# Patient Record
Sex: Male | Born: 1995 | Race: White | Hispanic: No | Marital: Married | Smoking: Former smoker
Health system: Southern US, Community
[De-identification: ages and names within clinical notes are randomized; demographics above are authoritative.]

---
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
## 2014-08-01 ENCOUNTER — Emergency Department
Admit: 2014-08-01 | Disposition: A | Discharge status: Home / Self Care | Payer: Self-pay | Admitting: Emergency Medicine

## 2014-08-01 LAB — CBC
HCT: 44.3 % (ref 40.0–52.0)
HGB: 14.7 g/dL (ref 13.0–18.0)
MCH: 28.4 pg (ref 26.0–34.0)
MCHC: 33.2 g/dL (ref 32.0–36.0)
MCV: 86 fL (ref 80–100)
Platelet: 258 10*3/uL (ref 150–440)
RBC: 5.17 10*6/uL (ref 4.40–5.90)
RDW: 13.3 % (ref 11.5–14.5)
WBC: 16.2 10*3/uL — ABNORMAL HIGH (ref 3.8–10.6)

## 2014-08-01 LAB — URINALYSIS, COMPLETE
BACTERIA: NONE SEEN
Bilirubin,UR: NEGATIVE
GLUCOSE, UR: NEGATIVE mg/dL (ref 0–75)
LEUKOCYTE ESTERASE: NEGATIVE
Nitrite: NEGATIVE
Ph: 5 (ref 4.5–8.0)
SPECIFIC GRAVITY: 1.033 (ref 1.003–1.030)

## 2014-08-01 LAB — COMPREHENSIVE METABOLIC PANEL
ALK PHOS: 71 U/L
ALT: 20 U/L
AST: 26 U/L
Albumin: 5.1 g/dL — ABNORMAL HIGH
Anion Gap: 8 (ref 7–16)
BUN: 11 mg/dL
Bilirubin,Total: 0.6 mg/dL
CHLORIDE: 102 mmol/L
Calcium, Total: 9 mg/dL
Co2: 22 mmol/L
Creatinine: 1.19 mg/dL
EGFR (African American): >60
Glucose: 129 mg/dL — ABNORMAL HIGH
Potassium: 3.1 mmol/L — ABNORMAL LOW
Sodium: 132 mmol/L — ABNORMAL LOW
Total Protein: 7.6 g/dL

## 2014-08-06 NOTE — Op Note (Signed)
PATIENT NAME:  Cameron Martin, Cameron Martin MR#:  [id number] DATE OF BIRTH:  [birth year]  DATE OF PROCEDURE:  06/03/2011  PREOPERATIVE DIAGNOSES:  1. Right acute otitis media.  2. Right facial nerve paralysis.   POSTOPERATIVE DIAGNOSES: 1. Right acute otitis media.  2. Right facial nerve paralysis.   PROCEDURE PERFORMED: Right myringotomy, modifier 22.   SURGEON: Kyung Rudd, MD    ANESTHESIA: General mask anesthesia.   ESTIMATED BLOOD LOSS: 0 mL.   IV FLUIDS: Please see anesthesia record.   COMPLICATIONS: None.   DRAINS/STENT PLACEMENTS: None.   SPECIMENS: Right middle ear fluid.   INDICATIONS FOR PROCEDURE: The patient is a 19 year old male with a one day history of a dense right facial nerve paralysis found on MRI and CT scan to have right middle ear effusion and opacification of mastoid cells. There is no evidence of coalescence of mastoid air cells. The patient presents for right myringotomy and possible tympanostomy tube placement.   OPERATIVE FINDINGS: Severely thickened right tympanic membrane. Myringotomy was initially placed in the posterior/inferior aspect of the tympanic membrane. Multiple vesicles and severe erythema and edema of the drum were encountered. Serous effusion was noted in the middle ear space. This was suctioned with a combination size 5 and size 24 otologic suctions. Severe thickening of the drum prevented placement of the tube despite multiple attempts and multiple myringotomies in the posterior/inferior and anterior/inferior aspect. While fluid was able to be expressed from the middle ear space, the tube was unable to be placed through the tympanic membrane secondary to severe thickening of the drum.   DESCRIPTION OF PROCEDURE: The patient was identified in holding, the benefits and risks of the procedure were discussed. Consent was reviewed. The patient was taken to the operating room and placed in the supine position. General mask anesthesia was induced. An  operating microscope was brought onto the field. An appropriate size speculum was placed in the patient's right external auditory canal and visualization was made of the canal. This demonstrated a severe erythematous drum with multiple vesicles on top of the drum. A myringotomy blade was used to lyse some of these vesicles for further evaluation of the drum. This revealed a dull and erythematous drum. In the posterior/inferior aspect of the tympanic membrane a myringotomy was placed and a significant amount of yellowish serous effusion was expressed from the middle ear space. This was removed with size 5 and size 24 otologic suction. At this time an attempt was made for placement of a myringotomy tube through myringotomy site. This was not possible secondary to severe thickening of the drum and continued failure of the PE tube to stay within the myringotomy site. Multiple attempts were made at slightly enlarging the myringotomy in this area and multiple repeated attempts were made with failure for keeping the tube through the myringotomy site. At this time continued serous effusion was coming from the middle ear space and decision was made to proceed to the anterior/inferior aspect of the tympanic membrane. Multiple vessels were lysed in this area again allowing for visualization of the of the normal anterior drum. A myringotomy was placed in a radial fashion and again serous fluid was expressed from the middle ear space through the myringotomy site and multiple attempts were made for placement of the PE tube through the anterior/inferior myringotomy site. Again, this was not successful due to the severe thickening of the drum and significant erythema surrounding and obscuring normal landmarks. At this time due to multiple myringotomies with failure  of placement of a tube for greater than 25 minutes, decision was made to abort the tympanostomy tube placement especially given the significant amount of fluid that had  been expressed from the anterior/inferior and posterior/inferior myringotomies. Ciprodex drops were instilled in the ear and care of the patient was transferred to anesthesia. The patient was in PAC-U in good condition.  ____________________________ Kyung Ruddreighton C. Laurance Heide, MD ccv:drc D:        06/03/2011 18:26:50 ET         T:         06/04/2011 07:41:06 ET         JOB#:  [id number]  cc:        Kyung Ruddreighton C. Sabas Frett, MD, <Dictator> Kyung RuddREIGHTON C Alanys Godino MD ELECTRONICALLY SIGNED 06/09/2011 9:59

## 2018-09-07 ENCOUNTER — Telehealth: Payer: Self-pay | Admitting: Nurse Practitioner

## 2018-09-07 DIAGNOSIS — J0100 Acute maxillary sinusitis, unspecified: Secondary | ICD-10-CM

## 2018-09-07 NOTE — Progress Notes (Signed)
We are sorry that you are not feeling well.  Here is how we plan to help!  This could very well b just a sinus infection, but you do have a lot of symptoms similar to corona virus. So if you are not getting better in the next few days then you may need to be tested.  Based on what you have shared with me it looks like you have sinusitis.  Sinusitis is inflammation and infection in the sinus cavities of the head.  Based on your presentation I believe you most likely have Acute Bacterial Sinusitis.  This is an infection caused by bacteria and is treated with antibiotics. I have prescribed Augmentin 875mg /125mg  one tablet twice daily with food, for 7 days. You may use an oral decongestant such as Mucinex D or if you have glaucoma or high blood pressure use plain Mucinex. Saline nasal spray help and can safely be used as often as needed for congestion.  If you develop worsening sinus pain, fever or notice severe headache and vision changes, or if symptoms are not better after completion of antibiotic, please schedule an appointment with a health care provider.    Sinus infections are not as easily transmitted as other respiratory infection, however we still recommend that you avoid close contact with loved ones, especially the very young and elderly.  Remember to wash your hands thoroughly throughout the day as this is the number one way to prevent the spread of infection!  Home Care:  Only take medications as instructed by your medical team.  Complete the entire course of an antibiotic.  Do not take these medications with alcohol.  A steam or ultrasonic humidifier can help congestion.  You can place a towel over your head and breathe in the steam from hot water coming from a faucet.  Avoid close contacts especially the very young and the elderly.  Cover your mouth when you cough or sneeze.  Always remember to wash your hands.  Get Help Right Away If:  You develop worsening fever or sinus  pain.  You develop a severe head ache or visual changes.  Your symptoms persist after you have completed your treatment plan.  Make sure you  Understand these instructions.  Will watch your condition.  Will get help right away if you are not doing well or get worse.  Your e-visit answers were reviewed by a board certified advanced clinical practitioner to complete your personal care plan.  Depending on the condition, your plan could have included both over the counter or prescription medications.  If there is a problem please reply  once you have received a response from your provider.  Your safety is important to [place].  If you have drug allergies check your prescription carefully.    You can use MyChart to ask questions about today's visit, request a non-urgent call back, or ask for a work or school excuse for 24 hours related to this e-Visit. If it has been greater than 24 hours you will need to follow up with your provider, or enter a new e-Visit to address those concerns.  You will get an e-mail in the next two days asking about your experience.  I hope that your e-visit has been valuable and will speed your recovery. Thank you for using e-visits.  5-10 minutes spent reviewing and documenting in chart.

## 2018-09-11 MED ORDER — AMOXICILLIN-POT CLAVULANATE 875-125 MG PO TABS: 1.0000 | ORAL_TABLET | Freq: Two times a day (BID) | ORAL | 0 refills | Status: DC

## 2018-09-11 NOTE — Addendum Note (Signed)
Addended by: Bennie Pierini on: 09/11/2018 09:59 AM   Modules accepted: Orders

## 2018-12-14 ENCOUNTER — Telehealth: Payer: Self-pay | Admitting: Nurse Practitioner

## 2018-12-14 DIAGNOSIS — R112 Nausea with vomiting, unspecified: Secondary | ICD-10-CM

## 2018-12-14 MED ORDER — ONDANSETRON HCL 4 MG PO TABS: 4.0000 mg | ORAL_TABLET | Freq: Three times a day (TID) | ORAL | 0 refills | Status: DC | PRN

## 2018-12-14 NOTE — Progress Notes (Signed)

## 2019-02-18 ENCOUNTER — Other Ambulatory Visit: Payer: Self-pay | Admitting: *Deleted

## 2019-02-18 ENCOUNTER — Telehealth: Payer: Self-pay | Admitting: Nurse Practitioner

## 2019-02-18 DIAGNOSIS — Z20822 Contact with and (suspected) exposure to covid-19: Secondary | ICD-10-CM

## 2019-02-18 DIAGNOSIS — R197 Diarrhea, unspecified: Secondary | ICD-10-CM

## 2019-02-18 MED ORDER — AZITHROMYCIN 500 MG PO TABS: 500.0000 mg | ORAL_TABLET | Freq: Every day | ORAL | 0 refills | Status: AC

## 2019-02-18 MED ORDER — ONDANSETRON HCL 4 MG PO TABS: 4.0000 mg | ORAL_TABLET | Freq: Three times a day (TID) | ORAL | 0 refills | Status: AC | PRN

## 2019-02-18 NOTE — Progress Notes (Signed)
We are sorry that you are not feeling well.  Here is how we plan to help!  Based on what you have shared with me it looks like you have Acute Infectious Diarrhea.  Most cases of acute diarrhea are due to infections with virus and bacteria and are self-limited conditions lasting less than 14 days.  For your symptoms you may take Imodium 2 mg tablets that are over the counter at your local pharmacy. Take two tablet now and then one after each loose stool up to 6 a day.  Antibiotics are not needed for most people with diarrhea.  Optional: Zofran 4 mg 1 tablet every 8 hours as needed for nausea and vomiting  Optional: I have prescribed azithromycin 500 mg daily for 3 days  HOME CARE We recommend changing your diet to help with your symptoms for the next few days. Drink plenty of fluids that contain water salt and sugar. Sports drinks such as Gatorade may help.  You may try broths, soups, bananas, applesauce, soft breads, mashed potatoes or crackers.  You are considered infectious for as long as the diarrhea continues. Hand washing or use of alcohol based hand sanitizers is recommend. It is best to stay out of work or school until your symptoms stop.   GET HELP RIGHT AWAY If you have dark yellow colored urine or do not pass urine frequently you should drink more fluids.   If your symptoms worsen  If you feel like you are going to pass out (faint) You have a new problem  MAKE SURE YOU  Understand these instructions. Will watch your condition. Will get help right away if you are not doing well or get worse.  Your e-visit answers were reviewed by a board certified advanced clinical practitioner to complete your personal care plan.  Depending on the condition, your plan could have included both over the counter or prescription medications.  If there is a problem please reply  once you have received a response from your provider.  Your safety is important to us.  If you have drug allergies  check your prescription carefully.    You can use MyChart to ask questions about today's visit, request a non-urgent call back, or ask for a work or school excuse for 24 hours related to this e-Visit. If it has been greater than 24 hours you will need to follow up with your provider, or enter a new e-Visit to address those concerns.   You will get an e-mail in the next two days asking about your experience.  I hope that your e-visit has been valuable and will speed your recovery. Thank you for using e-visits.   I have spent at least 5 minutes reviewing and documenting in the patient's chart.  

## 2019-02-19 LAB — NOVEL CORONAVIRUS, NAA: SARS-CoV-2, NAA: NOT DETECTED

## 2021-03-02 ENCOUNTER — Emergency Department
Admission: EM | Admit: 2021-03-02 | Discharge: 2021-03-02 | Disposition: A | Discharge status: Home / Self Care | Payer: Medicaid Other | Attending: Emergency Medicine | Admitting: Emergency Medicine

## 2021-03-02 ENCOUNTER — Other Ambulatory Visit: Payer: Self-pay

## 2021-03-02 ENCOUNTER — Emergency Department: Payer: Medicaid Other

## 2021-03-02 DIAGNOSIS — Y9301 Activity, walking, marching and hiking: Secondary | ICD-10-CM | POA: Insufficient documentation

## 2021-03-02 DIAGNOSIS — S92155A Nondisplaced avulsion fracture (chip fracture) of left talus, initial encounter for closed fracture: Secondary | ICD-10-CM | POA: Insufficient documentation

## 2021-03-02 DIAGNOSIS — M25572 Pain in left ankle and joints of left foot: Secondary | ICD-10-CM | POA: Insufficient documentation

## 2021-03-02 DIAGNOSIS — X501XXA Overexertion from prolonged static or awkward postures, initial encounter: Secondary | ICD-10-CM | POA: Insufficient documentation

## 2021-03-02 DIAGNOSIS — S93402A Sprain of unspecified ligament of left ankle, initial encounter: Secondary | ICD-10-CM

## 2021-03-02 MED ORDER — OXYCODONE-ACETAMINOPHEN 5-325 MG PO TABS: 1.0000 | ORAL_TABLET | Freq: Four times a day (QID) | ORAL | 0 refills | Status: DC | PRN

## 2021-03-02 MED ORDER — OXYCODONE-ACETAMINOPHEN 5-325 MG PO TABS: 2.0000 | ORAL_TABLET | ORAL | Status: DC

## 2021-03-02 MED ORDER — IBUPROFEN 600 MG PO TABS: 600.0000 mg | ORAL_TABLET | ORAL | Status: DC

## 2021-03-02 NOTE — Discharge Instructions (Addendum)
No driving or operating heavy equipment while taking oxycodone or while in ankle brace.

## 2021-03-02 NOTE — ED Notes (Signed)
Pt endorsing that they rolled their ankle last night on an unever surface and now they cannot bear weight on their L ankle. Pt L lateral ankle appears swollen and bruised,

## 2021-03-02 NOTE — ED Triage Notes (Signed)
Pt c/o rolling left ankle yesterday.

## 2021-03-02 NOTE — ED Provider Notes (Signed)
[facility] Emergency Department Provider Note   ____________________________________________   Event Date/Time   First MD Initiated Contact with Patient 03/02/21 1333     (approximate)  I have reviewed the triage vital signs and the nursing notes.   HISTORY  Chief Complaint Ankle Pain    HPI Cameron Martin is a 25 y.o. male who yesterday was walking and stepped off a curb but sort of Mr. Misjudged it.  His left ankle rolled underneath him.  He had pain and swelling throughout the evening.  A slight sense of off-and-on tingling in his toes of his left foot.  Not currently tingly or numb.  No loss of mobility other than is painful to move his ankle, and he is swollen behind the ankle and a little bit over the left side of the ankle  No other injuries no head strike.  No recent illnesses no cough cold or other symptoms.  Denies any injury elsewhere no knee or hip pain.  No injury arms or legs  Use Tylenol at home last night  History reviewed. No pertinent past medical history.  There are no problems to display for this patient.   History reviewed. No pertinent surgical history.  Medications, none except for as needed Tylenol last night   Allergies Patient has no allergy information on record.  No family history on file.  Social History    Review of Systems Constitutional: No fever/chills Eyes: No visual changes. ENT: No sore throat. Cardiovascular: Denies chest pain. Respiratory: Denies shortness of breath. Musculoskeletal: See HPI, all isolated left ankle Skin: Negative for rash. Neurological: The HPI.  No weakness in the leg    ____________________________________________   PHYSICAL EXAM:  VITAL SIGNS: ED Triage Vitals  Enc Vitals Group     BP 03/02/21 1130 (!) 133/92     Pulse Rate 03/02/21 1130 85     Resp 03/02/21 1129 20     Temp 03/02/21 1129 97.9 F (36.6 C)     Temp Source 03/02/21 1129 Oral     SpO2 03/02/21  1130 97 %     Weight 03/02/21 1129 285 lb (129.3 kg)     Height 03/02/21 1129 5\' 11"  (1.803 m)     Head Circumference --      Peak Flow --      Pain Score 03/02/21 1129 8     Pain Loc --      Pain Edu? --      Excl. in GC? --     Constitutional: Alert and oriented. Well appearing and in no acute distress.  Very pleasant seated upright watching television. Eyes: Conjunctivae are normal. Head: Atraumatic. Nose: No congestion/rhinnorhea. Mouth/Throat: Mucous membranes are moist. Neck: No stridor.  Cardiovascular: Strong peripheral pulses, normal dorsalis pedis and posterior tibial pulse left foot normal capillary refill all toes left foot with normal rate Respiratory: Normal respiratory effort.  No retractions. Gastrointestinal: Soft and nontender. No distention. Musculoskeletal:   Right lower extremity appears uninjured grossly normal use and range of motion.  Lower Extremities    Normal neuro-motor function lower extremities bilateral.  LEFT Left lower extremity demonstrates normal strength, good use of all muscles. No edema bruising or contusions of the hip,  ankle. Full range of motion of the left lower extremity without pain except some pain through range of motion particular on the left lateral ankle region.  Has moderate edema and some bruising over the lateral and posterior left ankle more so over the  foot and the ankle joint.  No noted joint effusion.  No pain across the foot except along the lateral tarsal region.  Full range of motion of the toes.   Neurologic:  Normal speech and language. No gross focal neurologic deficits are appreciated.  Skin:  Skin is warm, dry and intact. No rash noted. Psychiatric: Mood and affect are normal. Speech and behavior are normal.  ____________________________________________   LABS (all labs ordered are listed, but only abnormal results are displayed)  Labs Reviewed - No data to  display ____________________________________________  EKG   ____________________________________________  RADIOLOGY  DG Ankle Complete Left  Result Date: 03/02/2021 CLINICAL DATA:  Patient rolled left ankle yesterday now difficulty with weight-bearing. EXAM: LEFT ANKLE COMPLETE - 3+ VIEW COMPARISON:  None. FINDINGS: There is a tiny fragment along the lateral talus best seen on oblique view which could represent a tiny fracture. No additional acute fracture. No evidence of dislocation. There is lateral soft tissue swelling. IMPRESSION: Tiny fragment along the lateral talus could represent a fracture. There is associated lateral soft tissue swelling. Electronically Signed   By: Emmaline Kluver M.D.   On: 03/02/2021 12:27     X-rays reviewed tiny fragment lateral talus may represent small fracture. ____________________________________________   PROCEDURES  Procedure(s) performed: None  Procedures  Critical Care performed: No  ____________________________________________   INITIAL IMPRESSION / ASSESSMENT AND PLAN / ED COURSE  Pertinent labs & imaging results that were available during my care of the patient were reviewed by me and considered in my medical decision making (see chart for details).   Patient presents after rolling over his left ankle.  Appears to be isolated injury without evidence of acute neurologic vascular compromise.  No noted bony fracture except for possibly a very small avulsion type fragment, I suspect he may have suffered a notable lateral ankle sprain possibly associate with small chip fracture.  Patient placed in Aircast, crutches, pain control  I will prescribe the patient a narcotic pain medicine due to their condition which I anticipate will cause at least moderate pain short term. I discussed with the patient safe use of narcotic pain medicines, and that they are not to drive, work in dangerous areas, or ever take more than prescribed (no more than 1  pill every 6 hours). We discussed that this is the type of medication that can be  overdosed on and the risks of this type of medicine. Patient is very agreeable to only use as prescribed and to never use more than prescribed.  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  Patient understanding, agreeable with this and will discuss with them for possible work modification which I did recommend to him light duty.  We will be following up with orthopedics        ____________________________________________   FINAL CLINICAL IMPRESSION(S) / ED DIAGNOSES  Final diagnoses:  Sprain of left ankle, unspecified ligament, initial encounter  Nondisp avuls fracture (chip fracture) of left talus, init        Note:  This document was prepared using Dragon voice recognition software and may include unintentional dictation errors       Sharyn Creamer, MD 03/02/21 1441

## 2021-04-18 ENCOUNTER — Telehealth: Payer: Medicaid Other | Admitting: Family Medicine

## 2021-04-18 DIAGNOSIS — B9689 Other specified bacterial agents as the cause of diseases classified elsewhere: Secondary | ICD-10-CM

## 2021-04-18 DIAGNOSIS — J019 Acute sinusitis, unspecified: Secondary | ICD-10-CM

## 2021-04-18 MED ORDER — DOXYCYCLINE HYCLATE 100 MG PO TABS: 100.0000 mg | ORAL_TABLET | Freq: Two times a day (BID) | ORAL | 0 refills | Status: AC

## 2021-04-18 NOTE — Progress Notes (Signed)

## 2021-06-26 ENCOUNTER — Telehealth: Payer: 59 | Admitting: Physician Assistant

## 2021-06-26 DIAGNOSIS — R112 Nausea with vomiting, unspecified: Secondary | ICD-10-CM | POA: Diagnosis not present

## 2021-06-26 DIAGNOSIS — R197 Diarrhea, unspecified: Secondary | ICD-10-CM | POA: Diagnosis not present

## 2021-06-26 DIAGNOSIS — J069 Acute upper respiratory infection, unspecified: Secondary | ICD-10-CM

## 2021-06-26 MED ORDER — ONDANSETRON HCL 4 MG PO TABS: 4.0000 mg | ORAL_TABLET | Freq: Three times a day (TID) | ORAL | 0 refills | Status: AC | PRN

## 2021-06-26 MED ORDER — FLUTICASONE PROPIONATE 50 MCG/ACT NA SUSP: 2.0000 | Freq: Every day | NASAL | 0 refills | Status: DC

## 2021-06-26 MED ORDER — BENZONATATE 100 MG PO CAPS: 100.0000 mg | ORAL_CAPSULE | Freq: Three times a day (TID) | ORAL | 0 refills | Status: AC

## 2021-06-26 NOTE — Progress Notes (Signed)
E-Visit for Upper Respiratory Infection  ? ?We are sorry you are not feeling well.  Here is how we plan to help! ? ?Based on what you have shared with me, it looks like you may have a viral upper respiratory infection.  Upper respiratory infections are caused by a large number of viruses; however, rhinovirus is the most common cause.  ? ?Symptoms vary from person to person, with common symptoms including sore throat, cough, fatigue or lack of energy and feeling of general discomfort.  A low-grade fever of up to 100.4 may present, but is often uncommon.  Symptoms vary however, and are closely related to a person's age or underlying illnesses.  The most common symptoms associated with an upper respiratory infection are nasal discharge or congestion, cough, sneezing, headache and pressure in the ears and face.  These symptoms usually persist for about 3 to 10 days, but can last up to 2 weeks.  It is important to know that upper respiratory infections do not cause serious illness or complications in most cases.   ? ?Upper respiratory infections can be transmitted from person to person, with the most common method of transmission being a person's hands.  The virus is able to live on the skin and can infect other persons for up to 2 hours after direct contact.  Also, these can be transmitted when someone coughs or sneezes; thus, it is important to cover the mouth to reduce this risk.  To keep the spread of the illness at bay, good hand hygiene is very important. ? ?This is an infection that is most likely caused by a virus. There are no specific treatments other than to help you with the symptoms until the infection runs its course.  We are sorry you are not feeling well.  Here is how we plan to help! ? ? ?For nasal congestion, you may use an oral decongestants such as Mucinex D or if you have glaucoma or high blood pressure use plain Mucinex.  Saline nasal spray or nasal drops can help and can safely be used as often as  needed for congestion.  For your congestion, I have prescribed Fluticasone nasal spray one spray in each nostril twice a day ? ?If you do not have a history of heart disease, hypertension, diabetes or thyroid disease, prostate/bladder issues or glaucoma, you may also use Sudafed to treat nasal congestion.  It is highly recommended that you consult with a pharmacist or your primary care physician to ensure this medication is safe for you to take.    ? ?If you have a cough, you may use cough suppressants such as Delsym and Robitussin.  If you have glaucoma or high blood pressure, you can also use Coricidin HBP.   ?For cough I have prescribed for you A prescription cough medication called Tessalon Perles 100 mg. You may take 1-2 capsules every 8 hours as needed for cough ? ?For vomiting I have prescribed zofran 4 mg to take every 8 hours. ? ?If you have a sore or scratchy throat, use a saltwater gargle- ? to ? teaspoon of salt dissolved in a 4-ounce to 8-ounce glass of warm water.  Gargle the solution for approximately 15-30 seconds and then spit.  It is important not to swallow the solution.  You can also use throat lozenges/cough drops and Chloraseptic spray to help with throat pain or discomfort.  Warm or cold liquids can also be helpful in relieving throat pain. ? ?For headache, pain or general  discomfort, you can use Ibuprofen or Tylenol as directed.   ?Some authorities believe that zinc sprays or the use of Echinacea may shorten the course of your symptoms. ? ? ?HOME CARE ?Only take medications as instructed by your medical team. ?Be sure to drink plenty of fluids. Water is fine as well as fruit juices, sodas and electrolyte beverages. You may want to stay away from caffeine or alcohol. If you are nauseated, try taking small sips of liquids. How do you know if you are getting enough fluid? Your urine should be a pale yellow or almost colorless. ?Get rest. ?Taking a steamy shower or using a humidifier may help  nasal congestion and ease sore throat pain. You can place a towel over your head and breathe in the steam from hot water coming from a faucet. ?Using a saline nasal spray works much the same way. ?Cough drops, hard candies and sore throat lozenges may ease your cough. ?Avoid close contacts especially the very young and the elderly ?Cover your mouth if you cough or sneeze ?Always remember to wash your hands.  ? ?GET HELP RIGHT AWAY IF: ?You develop worsening fever. ?If your symptoms do not improve within 10 days ?You develop yellow or green discharge from your nose over 3 days. ?You have coughing fits ?You develop a severe head ache or visual changes. ?You develop shortness of breath, difficulty breathing or start having chest pain ?Your symptoms persist after you have completed your treatment plan ? ?MAKE SURE YOU  ?Understand these instructions. ?Will watch your condition. ?Will get help right away if you are not doing well or get worse. ? ?Thank you for choosing an e-visit. ? ?Your e-visit answers were reviewed by a board certified advanced clinical practitioner to complete your personal care plan. Depending upon the condition, your plan could have included both over the counter or prescription medications. ? ?Please review your pharmacy choice. Make sure the pharmacy is open so you can pick up prescription now. If there is a problem, you may contact your provider through [employer] and have the prescription routed to another pharmacy.  Your safety is important to [place]. If you have drug allergies check your prescription carefully.  ? ?For the next 24 hours you can use MyChart to ask questions about today's visit, request a non-urgent call back, or ask for a work or school excuse. ?You will get an email in the next two days asking about your experience. I hope that your e-visit has been valuable and will speed your recovery. ? ?Approximately 5 minutes was spent documenting and reviewing patient's  chart. ? ? ? ?

## 2021-06-27 ENCOUNTER — Emergency Department
Admission: EM | Admit: 2021-06-27 | Discharge: 2021-06-27 | Disposition: A | Discharge status: Home / Self Care | Payer: 59 | Attending: Student in an Organized Health Care Education/Training Program | Admitting: Student in an Organized Health Care Education/Training Program

## 2021-06-27 ENCOUNTER — Other Ambulatory Visit: Payer: Self-pay

## 2021-06-27 ENCOUNTER — Encounter: Payer: Self-pay | Admitting: Emergency Medicine

## 2021-06-27 ENCOUNTER — Emergency Department: Payer: 59

## 2021-06-27 DIAGNOSIS — Z20822 Contact with and (suspected) exposure to covid-19: Secondary | ICD-10-CM | POA: Diagnosis not present

## 2021-06-27 DIAGNOSIS — R000 Tachycardia, unspecified: Secondary | ICD-10-CM | POA: Diagnosis not present

## 2021-06-27 DIAGNOSIS — R0789 Other chest pain: Secondary | ICD-10-CM | POA: Insufficient documentation

## 2021-06-27 DIAGNOSIS — J181 Lobar pneumonia, unspecified organism: Secondary | ICD-10-CM | POA: Diagnosis not present

## 2021-06-27 DIAGNOSIS — R059 Cough, unspecified: Secondary | ICD-10-CM | POA: Diagnosis present

## 2021-06-27 DIAGNOSIS — M791 Myalgia, unspecified site: Secondary | ICD-10-CM | POA: Diagnosis not present

## 2021-06-27 LAB — RESP PANEL BY RT-PCR (FLU A&B, COVID) ARPGX2
Influenza A by PCR: NEGATIVE
Influenza B by PCR: NEGATIVE
SARS Coronavirus 2 by RT PCR: NEGATIVE

## 2021-06-27 LAB — GROUP A STREP BY PCR: Group A Strep by PCR: NOT DETECTED

## 2021-06-27 LAB — MONONUCLEOSIS SCREEN: Mono Screen: NEGATIVE

## 2021-06-27 MED ORDER — DOXYCYCLINE HYCLATE 100 MG PO TABS: 100.0000 mg | ORAL_TABLET | Freq: Two times a day (BID) | ORAL | 0 refills | Status: AC

## 2021-06-27 NOTE — ED Provider Notes (Signed)
? ?[facility] ?Provider Note ? ? ? Event Date/Time  ? First MD Initiated Contact with Patient 06/27/21 [phone]   ?  (approximate) ? ? ?History  ? ?Chief Complaint ?URI ? ? ?HPI ?Cameron Martin is a 26 y.o. male, no remarkable medical history, presents to the emergency department for evaluation of flulike symptoms.  Patient reports body aches, cough, congestion, and fever at home.  He states that his daughter has also been recently sick with similar symptoms, though reportedly did not test positive for anything when evaluated in the emergency department.  Patient states that his symptoms have been going on for the past 4 days.  Reports some chest tightness, but otherwise no pain.  Denies abdominal pain, nausea/vomiting, urinary symptoms, rashes, headache, dizziness/lightheadedness, or diarrhea. ? ?History Limitations: No limitations. ? ?  ? ? ?Physical Exam  ?Triage Vital Signs: ?ED Triage Vitals [06/27/21 0921]  ?Enc Vitals Group  ?   BP   ?   Pulse   ?   Resp   ?   Temp   ?   Temp src   ?   SpO2   ?   Weight 285 lb 0.9 oz (129.3 kg)  ?   Height 5\' 11"  (1.803 m)  ?   Head Circumference   ?   Peak Flow   ?   Pain Score   ?   Pain Loc   ?   Pain Edu?   ?   Excl. in GC?   ? ? ?Most recent vital signs: ?Vitals:  ? 06/27/21 0931  ?BP: 138/74  ?Pulse: (!) 120  ?Resp: 20  ?Temp: 98 ?F (36.7 ?C)  ?SpO2: 98%  ? ? ?General: Awake, NAD. ?CV: Good peripheral perfusion.  ?Resp: Normal effort.  Lung sounds clear bilaterally in the apices and bases. ?Abd: Soft, non-tender. No distention.  ?Neuro: At baseline. No gross neurological deficits.  ?Other: Oropharynx mildly erythematous.  No remarkable tonsillar swelling.  Uvula midline. ? ?Physical Exam ? ? ? ?ED Results / Procedures / Treatments  ?Labs ?(all labs ordered are listed, but only abnormal results are displayed) ?Labs Reviewed  ?RESP PANEL BY RT-PCR (FLU A&B, COVID) [id number]  ?GROUP A STREP BY PCR  ?MONONUCLEOSIS SCREEN  ? ? ? ?EKG ?Not  applicable. ? ? ?RADIOLOGY ? ?ED Provider Interpretation: I personally reviewed and interpreted this chest x-ray, opacification in the right upper lobe, suggestive of pneumonia ? ?DG Chest 2 View ? ?Result Date: 06/27/2021 ?CLINICAL DATA:  Fever and cough. EXAM: CHEST - 2 VIEW COMPARISON:  None. FINDINGS: Cardiac silhouette and mediastinal contours are within normal limits. Moderate heterogeneous airspace opacification of the right upper lung. The left lung is clear. No pleural effusion or pneumothorax. No acute skeletal abnormality. IMPRESSION: Heterogeneous airspace opacification within the right upper lobe suggesting pneumonia. Recommend follow-up radiographs 6 weeks after treatment to ensure resolution. Electronically Signed   By: 06/29/2021 M.D.   On: 06/27/2021 09:55   ? ?PROCEDURES: ? ?Critical Care performed: None. ? ?Procedures ? ? ? ?MEDICATIONS ORDERED IN ED: ?Medications - No data to display ? ? ?IMPRESSION / MDM / ASSESSMENT AND PLAN / ED COURSE  ?I reviewed the triage vital signs and the nursing notes. ?             ?               ? ?Differential diagnosis includes, but is not limited to, influenza, COVID-19, mononucleosis, strep pharyngitis, viral URI,  tonsillitis ? ?ED Course ?Patient appears well.  Notably tachycardic at 120, otherwise normal vitals.  Afebrile. ? ?Chest x-ray shows evidence of pneumonia.  Pending respiratory panel, group A strep, and mononucleosis screen ? ?Assessment/Plan ?Presentation consistent with community-acquired pneumonia.  Patient is currently stable at this time.  No significant comorbidities.  Will discharge with a prescription for doxycycline.  Advised patient that he could look up the results of his respiratory panel, group A strep, and mononucleosis screen online.  Will not change management at this point if any are positive.  Advised him to follow-up with his primary care provider within the next week to ensure efficacy of treatment and to schedule a chest x-ray 6  weeks from now to ensure resolution of pneumonia.  ? ?Considered admission for this patient, but given the patient's stable presentation, he is unlikely to benefit.  We will plan to discharge. ? ?Patient was provided with anticipatory guidance, return precautions, and educational material. Encouraged the patient to return to the emergency department at any time if they begin to experience any new or worsening symptoms.  ? ?  ? ? ?FINAL CLINICAL IMPRESSION(S) / ED DIAGNOSES  ? ?Final diagnoses:  ?Community acquired pneumonia of right upper lobe of lung  ? ? ? ?Rx / DC Orders  ? ?ED Discharge Orders   ? ?      Ordered  ?  doxycycline (VIBRA-TABS) 100 MG tablet  2 times daily       ? 06/27/21 1013  ? ?  ?  ? ?  ? ? ? ?Note:  This document was prepared using Dragon voice recognition software and may include unintentional dictation errors. ?  ?Varney Daily, [place] ?06/27/21 1935 ? ?  ?Willy Eddy, MD ?06/28/21 0725 ? ?

## 2021-06-27 NOTE — ED Triage Notes (Signed)
Presents with body aches,cough,congestion and fever   states sxs' started Sunday   ?

## 2021-06-27 NOTE — Discharge Instructions (Addendum)
-  Follow-up with your primary care provider within the next week in order to ensure treatment efficacy and to schedule follow-up chest x-ray in 6 weeks. ?-Take all your antibiotics as prescribed. ?-Return to the emergency department anytime if you begin to experience any new or worsening symptoms. ?

## 2022-11-22 ENCOUNTER — Telehealth: Payer: Medicaid Other | Admitting: Family Medicine

## 2022-11-22 DIAGNOSIS — J069 Acute upper respiratory infection, unspecified: Secondary | ICD-10-CM | POA: Diagnosis not present

## 2022-11-22 NOTE — Progress Notes (Signed)

## 2022-12-21 IMAGING — CR DG CHEST 2V
2 series · 2 of 2 positions shown · non-contrast
Comparison: None.

CLINICAL DATA: Fever and cough.

EXAM:
CHEST - 2 VIEW

[chest pa]
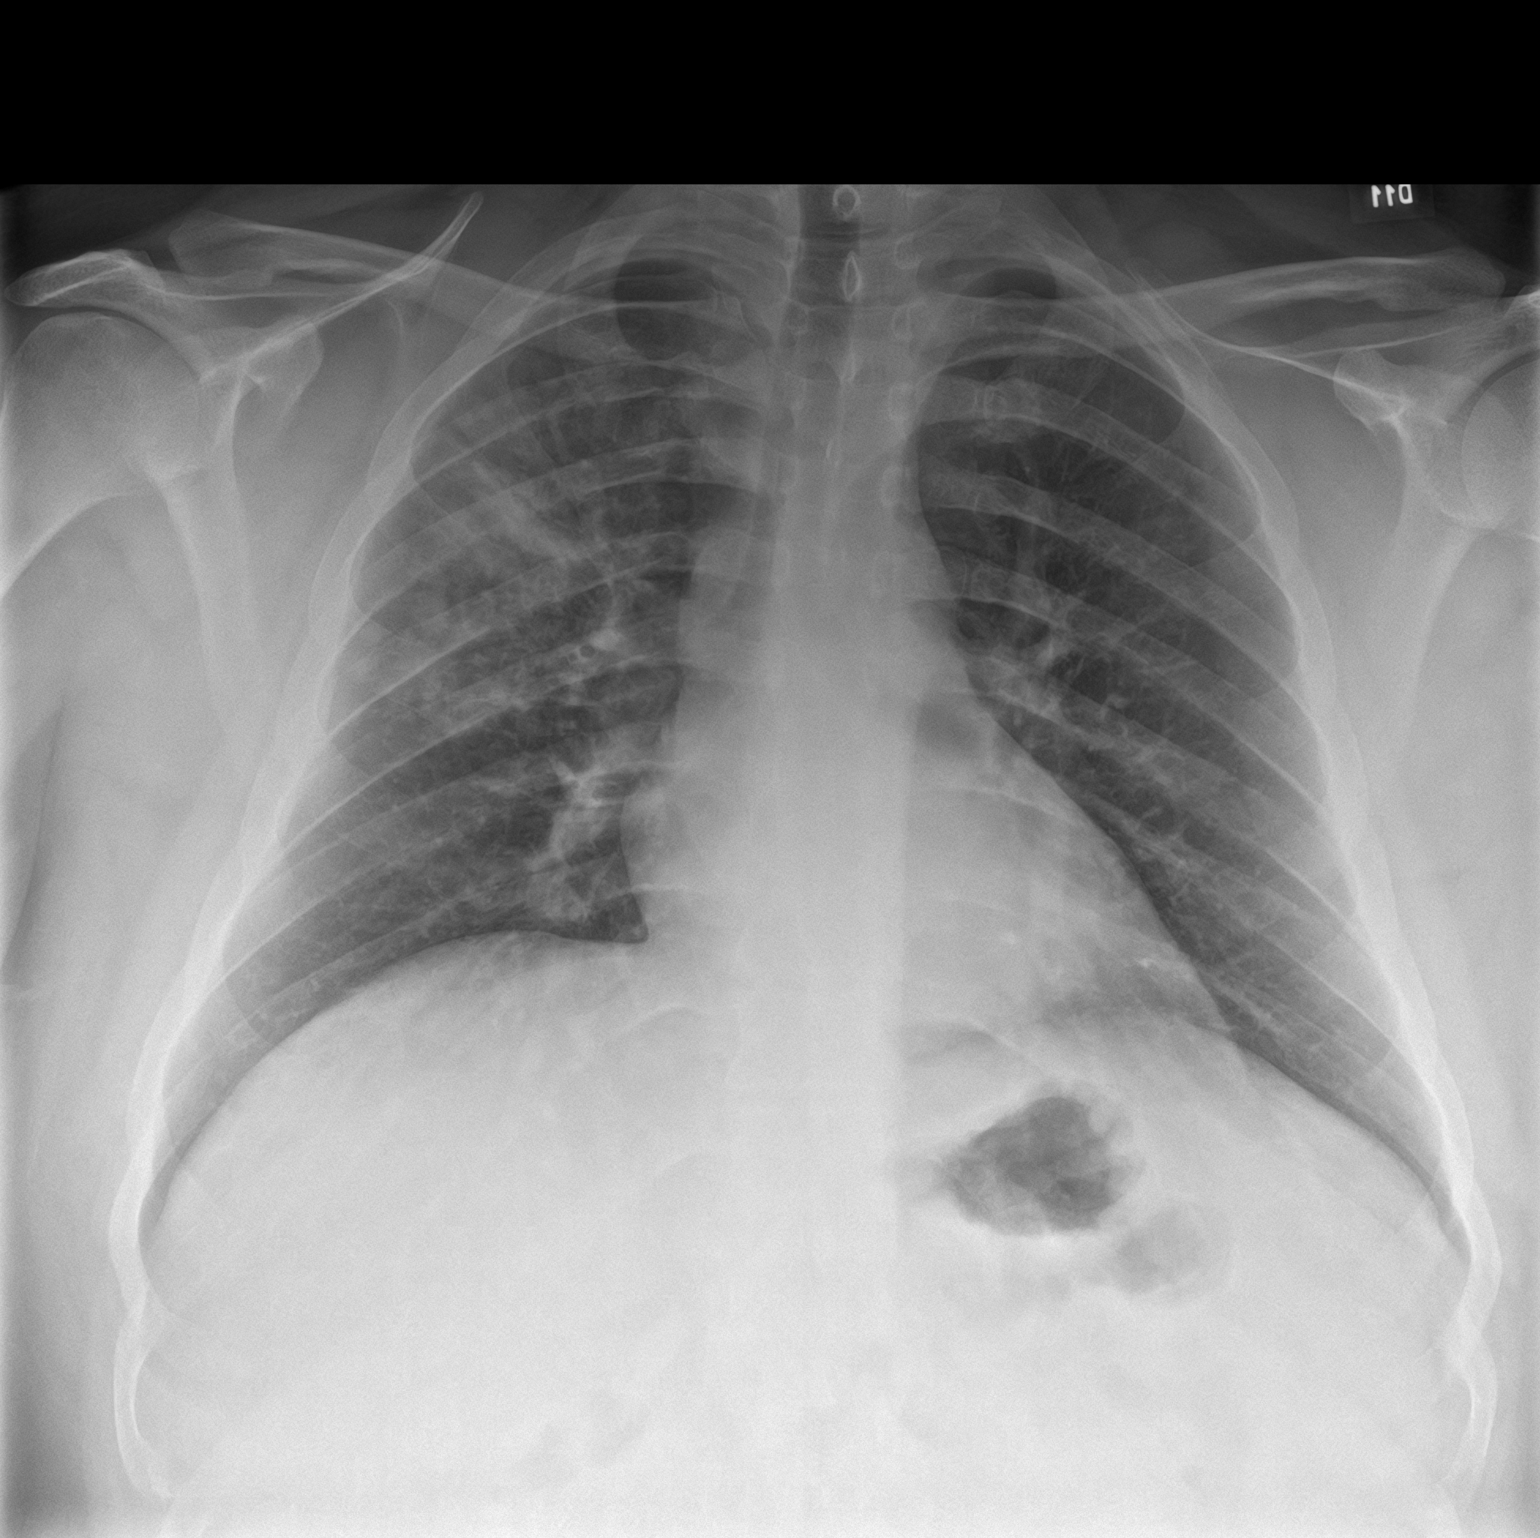

[chest lat]
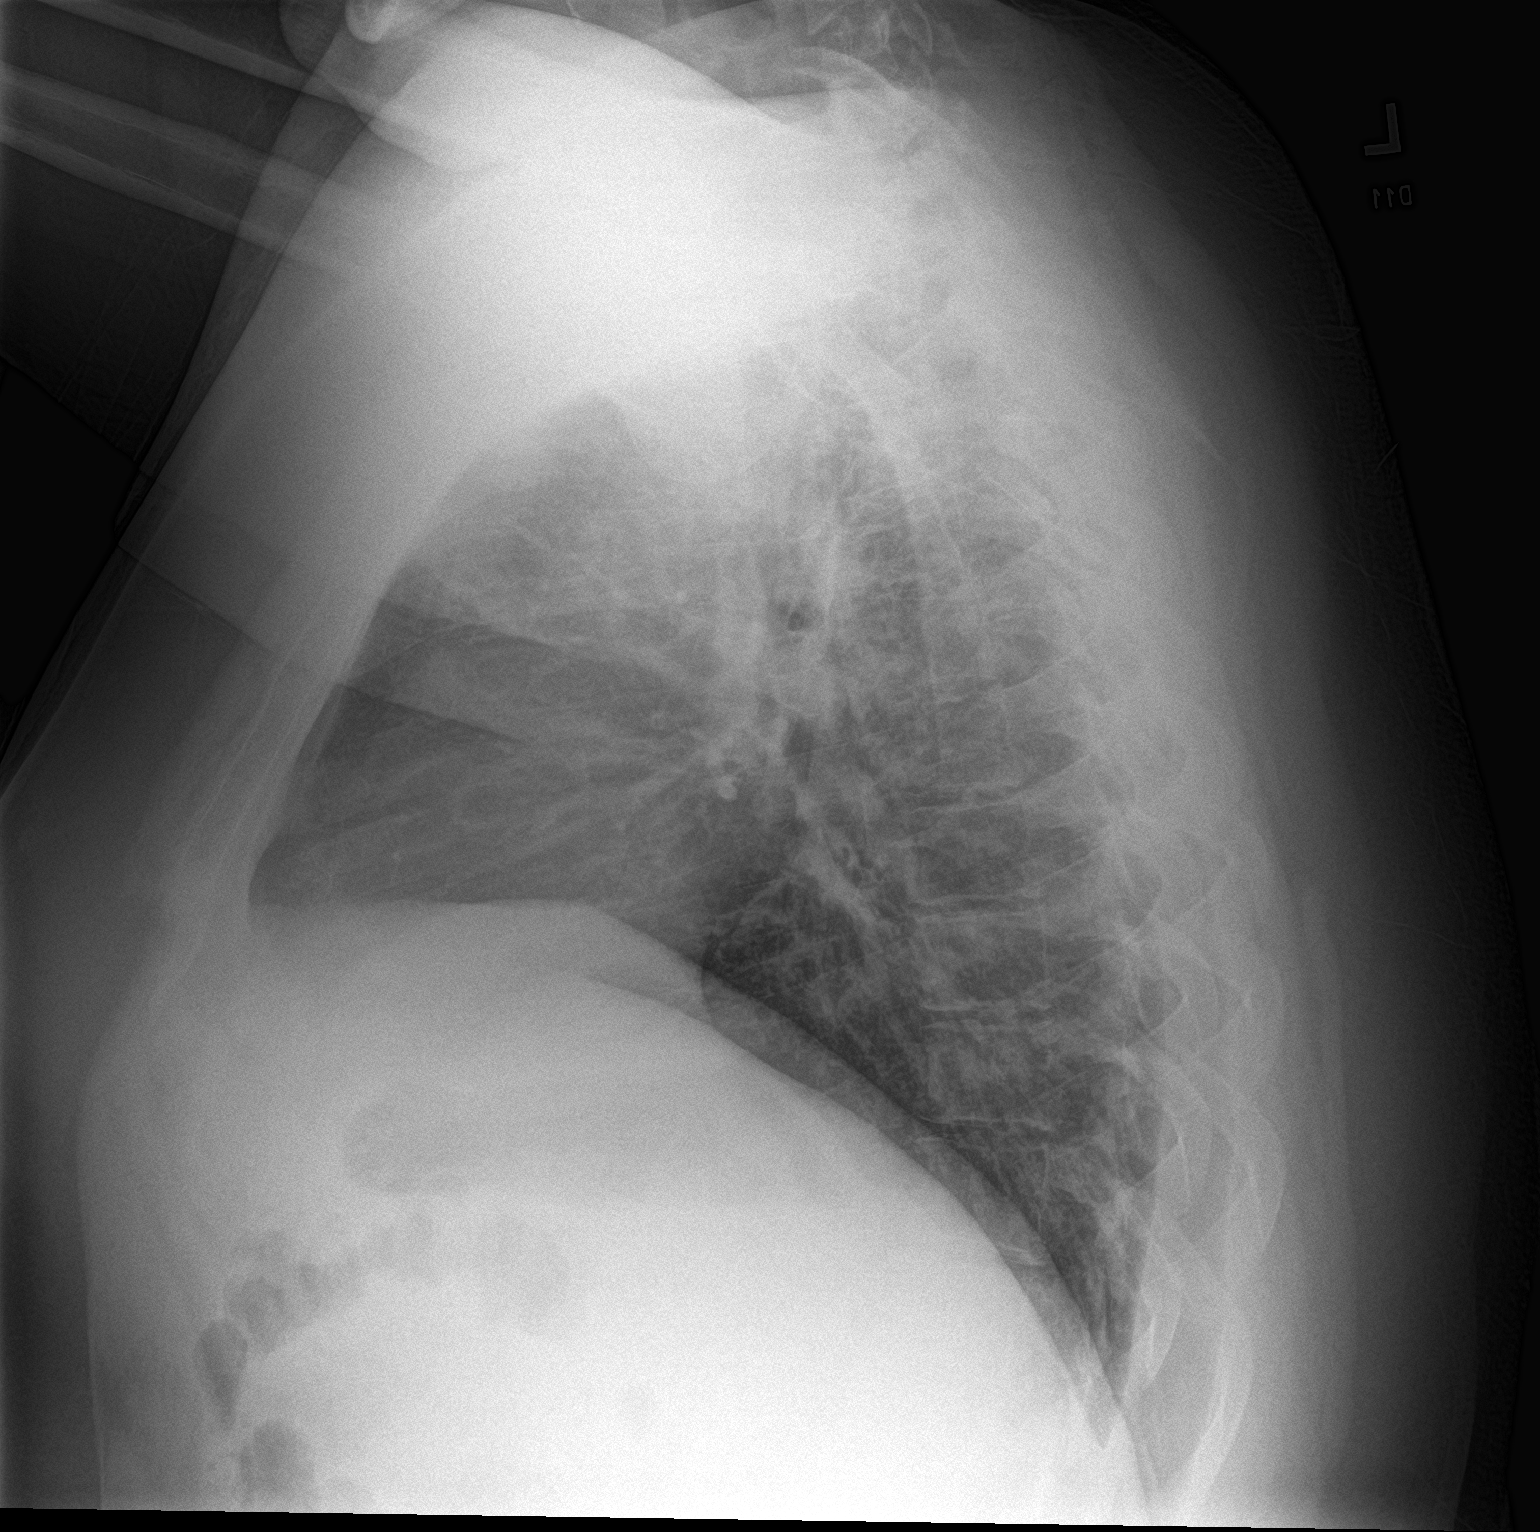

[2 of 2 positions shown; findings below may reference images not displayed]

FINDINGS: Cardiac silhouette and mediastinal contours are within normal
limits. Moderate heterogeneous airspace opacification of the right
upper lung. The left lung is clear. No pleural effusion or
pneumothorax. No acute skeletal abnormality.
IMPRESSION: Heterogeneous airspace opacification within the right upper lobe
suggesting pneumonia. Recommend follow-up radiographs 6 weeks after
treatment to ensure resolution.

## 2023-01-12 ENCOUNTER — Telehealth: Payer: Medicaid Other | Admitting: Physician Assistant

## 2023-01-12 DIAGNOSIS — J069 Acute upper respiratory infection, unspecified: Secondary | ICD-10-CM

## 2023-01-13 MED ORDER — FLUTICASONE PROPIONATE 50 MCG/ACT NA SUSP: 2.0000 | Freq: Every day | NASAL | 0 refills | Status: AC

## 2023-01-13 MED ORDER — BENZONATATE 100 MG PO CAPS: 100.0000 mg | ORAL_CAPSULE | Freq: Three times a day (TID) | ORAL | 0 refills | Status: AC | PRN

## 2023-01-13 NOTE — Progress Notes (Signed)
I have spent 5 minutes in review of e-visit questionnaire, review and updating patient chart, medical decision making and response to patient.   Mia Milan Cody Jacklynn Dehaas, PA-C    

## 2023-01-13 NOTE — Progress Notes (Signed)
